# Patient Record
Sex: Male | Born: 1995 | Race: White | Hispanic: No | Marital: Single
Health system: Southern US, Community
[De-identification: ages and names within clinical notes are randomized; demographics above are authoritative.]

---

## 2020-08-24 ENCOUNTER — Other Ambulatory Visit: Payer: Self-pay

## 2020-08-24 ENCOUNTER — Emergency Department (HOSPITAL_COMMUNITY): Payer: No Typology Code available for payment source

## 2020-08-24 ENCOUNTER — Emergency Department (HOSPITAL_COMMUNITY)
Admission: EM | Admit: 2020-08-24 | Discharge: 2020-08-24 | Disposition: A | Discharge status: Home / Self Care | Payer: No Typology Code available for payment source | Attending: Emergency Medicine | Admitting: Emergency Medicine

## 2020-08-24 DIAGNOSIS — S8012XA Contusion of left lower leg, initial encounter: Secondary | ICD-10-CM | POA: Diagnosis not present

## 2020-08-24 DIAGNOSIS — M25552 Pain in left hip: Secondary | ICD-10-CM | POA: Diagnosis not present

## 2020-08-24 DIAGNOSIS — R519 Headache, unspecified: Secondary | ICD-10-CM | POA: Diagnosis not present

## 2020-08-24 DIAGNOSIS — Y92410 Unspecified street and highway as the place of occurrence of the external cause: Secondary | ICD-10-CM | POA: Insufficient documentation

## 2020-08-24 DIAGNOSIS — S8992XA Unspecified injury of left lower leg, initial encounter: Secondary | ICD-10-CM | POA: Diagnosis present

## 2020-08-24 DIAGNOSIS — S0093XA Contusion of unspecified part of head, initial encounter: Secondary | ICD-10-CM | POA: Diagnosis not present

## 2020-08-24 LAB — COMPREHENSIVE METABOLIC PANEL
ALT: 20 U/L (ref 0–44)
AST: 28 U/L (ref 15–41)
Albumin: 5.1 g/dL — ABNORMAL HIGH (ref 3.5–5.0)
Alkaline Phosphatase: 47 U/L (ref 38–126)
Anion gap: 9 (ref 5–15)
BUN: 19 mg/dL (ref 6–20)
CO2: 28 mmol/L (ref 22–32)
Calcium: 9.5 mg/dL (ref 8.9–10.3)
Chloride: 101 mmol/L (ref 98–111)
Creatinine, Ser: 0.89 mg/dL (ref 0.61–1.24)
GFR, Estimated: >60 mL/min (ref >60)
Glucose, Bld: 88 mg/dL (ref 70–99)
Potassium: 3.4 mmol/L — ABNORMAL LOW (ref 3.5–5.1)
Sodium: 138 mmol/L (ref 135–145)
Total Bilirubin: 1.1 mg/dL (ref 0.3–1.2)
Total Protein: 7.6 g/dL (ref 6.5–8.1)

## 2020-08-24 LAB — CBC WITH DIFFERENTIAL/PLATELET
Abs Immature Granulocytes: 0.03 10*3/uL (ref 0.00–0.07)
Basophils Absolute: 0.1 10*3/uL (ref 0.0–0.1)
Basophils Relative: 1 %
Eosinophils Absolute: 0.1 10*3/uL (ref 0.0–0.5)
Eosinophils Relative: 1 %
HCT: 46.6 % (ref 39.0–52.0)
Hemoglobin: 15.6 g/dL (ref 13.0–17.0)
Immature Granulocytes: 0 %
Lymphocytes Relative: 25 %
Lymphs Abs: 2 10*3/uL (ref 0.7–4.0)
MCH: 29.7 pg (ref 26.0–34.0)
MCHC: 33.5 g/dL (ref 30.0–36.0)
MCV: 88.6 fL (ref 80.0–100.0)
Monocytes Absolute: 0.6 10*3/uL (ref 0.1–1.0)
Monocytes Relative: 7 %
Neutro Abs: 5.4 10*3/uL (ref 1.7–7.7)
Neutrophils Relative %: 66 %
Platelets: 227 10*3/uL (ref 150–400)
RBC: 5.26 MIL/uL (ref 4.22–5.81)
RDW: 12.5 % (ref 11.5–15.5)
WBC: 8.1 10*3/uL (ref 4.0–10.5)
nRBC: 0 % (ref 0.0–0.2)

## 2020-08-24 LAB — TYPE AND SCREEN
ABO/RH(D): AB POS
Antibody Screen: NEGATIVE

## 2020-08-24 MED ORDER — IOHEXOL 300 MG/ML  SOLN
100.0000 mL | Freq: Once | INTRAMUSCULAR | Status: AC | PRN
  Administered 2020-08-24: 100 mL via INTRAVENOUS

## 2020-08-24 MED ORDER — IBUPROFEN 800 MG PO TABS
800.0000 mg | ORAL_TABLET | Freq: Once | ORAL | Status: AC
  Administered 2020-08-24: 800 mg via ORAL
  Filled 2020-08-24: qty 1

## 2020-08-24 MED ORDER — SODIUM CHLORIDE 0.9 % IV BOLUS
1000.0000 mL | Freq: Once | INTRAVENOUS | Status: AC
  Administered 2020-08-24: 1000 mL via INTRAVENOUS

## 2020-08-24 MED ORDER — IBUPROFEN 800 MG PO TABS: 800.0000 mg | ORAL_TABLET | Freq: Three times a day (TID) | ORAL | 1 refills | Status: AC | PRN
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
## 2020-08-24 NOTE — Progress Notes (Signed)
Per Dr. Estell Harpin, he did not want CT to wait for labs prior to the patient's CT scan

## 2020-08-24 NOTE — Discharge Instructions (Addendum)
Clean your leg twice a day with soap and water and redress it.  Follow-up with your family doctor next week for recheck.  Return if any problems
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
